# Patient Record
Sex: Male | Born: 1965 | Race: White | Hispanic: No | Marital: Single | Smoking: Heavy tobacco smoker
Health system: Southern US, Community
[De-identification: ages and names within clinical notes are randomized; demographics above are authoritative.]

---

## 2006-09-28 ENCOUNTER — Emergency Department: Payer: Self-pay | Admitting: Emergency Medicine

## 2006-10-12 ENCOUNTER — Other Ambulatory Visit: Payer: Self-pay

## 2006-10-13 ENCOUNTER — Ambulatory Visit: Payer: Self-pay | Admitting: Vascular Surgery

## 2007-04-05 ENCOUNTER — Ambulatory Visit: Payer: Self-pay | Admitting: Internal Medicine

## 2007-05-24 ENCOUNTER — Ambulatory Visit: Payer: Self-pay | Admitting: Emergency Medicine

## 2008-01-05 ENCOUNTER — Ambulatory Visit: Payer: Self-pay | Admitting: Internal Medicine

## 2008-05-01 ENCOUNTER — Ambulatory Visit: Payer: Self-pay | Admitting: Internal Medicine

## 2009-01-23 IMAGING — CR RIGHT HAND - COMPLETE 3+ VIEW
1 series · 3 of 3 positions shown · non-contrast
Comparison: none

REASON FOR EXAM: Injury
COMMENTS:

PROCEDURE:     MDR - MDR HAND RT COMP W/OBLIQUES  - January 05, 2008  [DATE]
RESULT:     Three views were obtained.
No fracture, dislocation or other acute bony abnormality is identified.

[Series 1: view not recorded · 0.17mm/px · 3 of 3 slices shown]
[im 1/3]
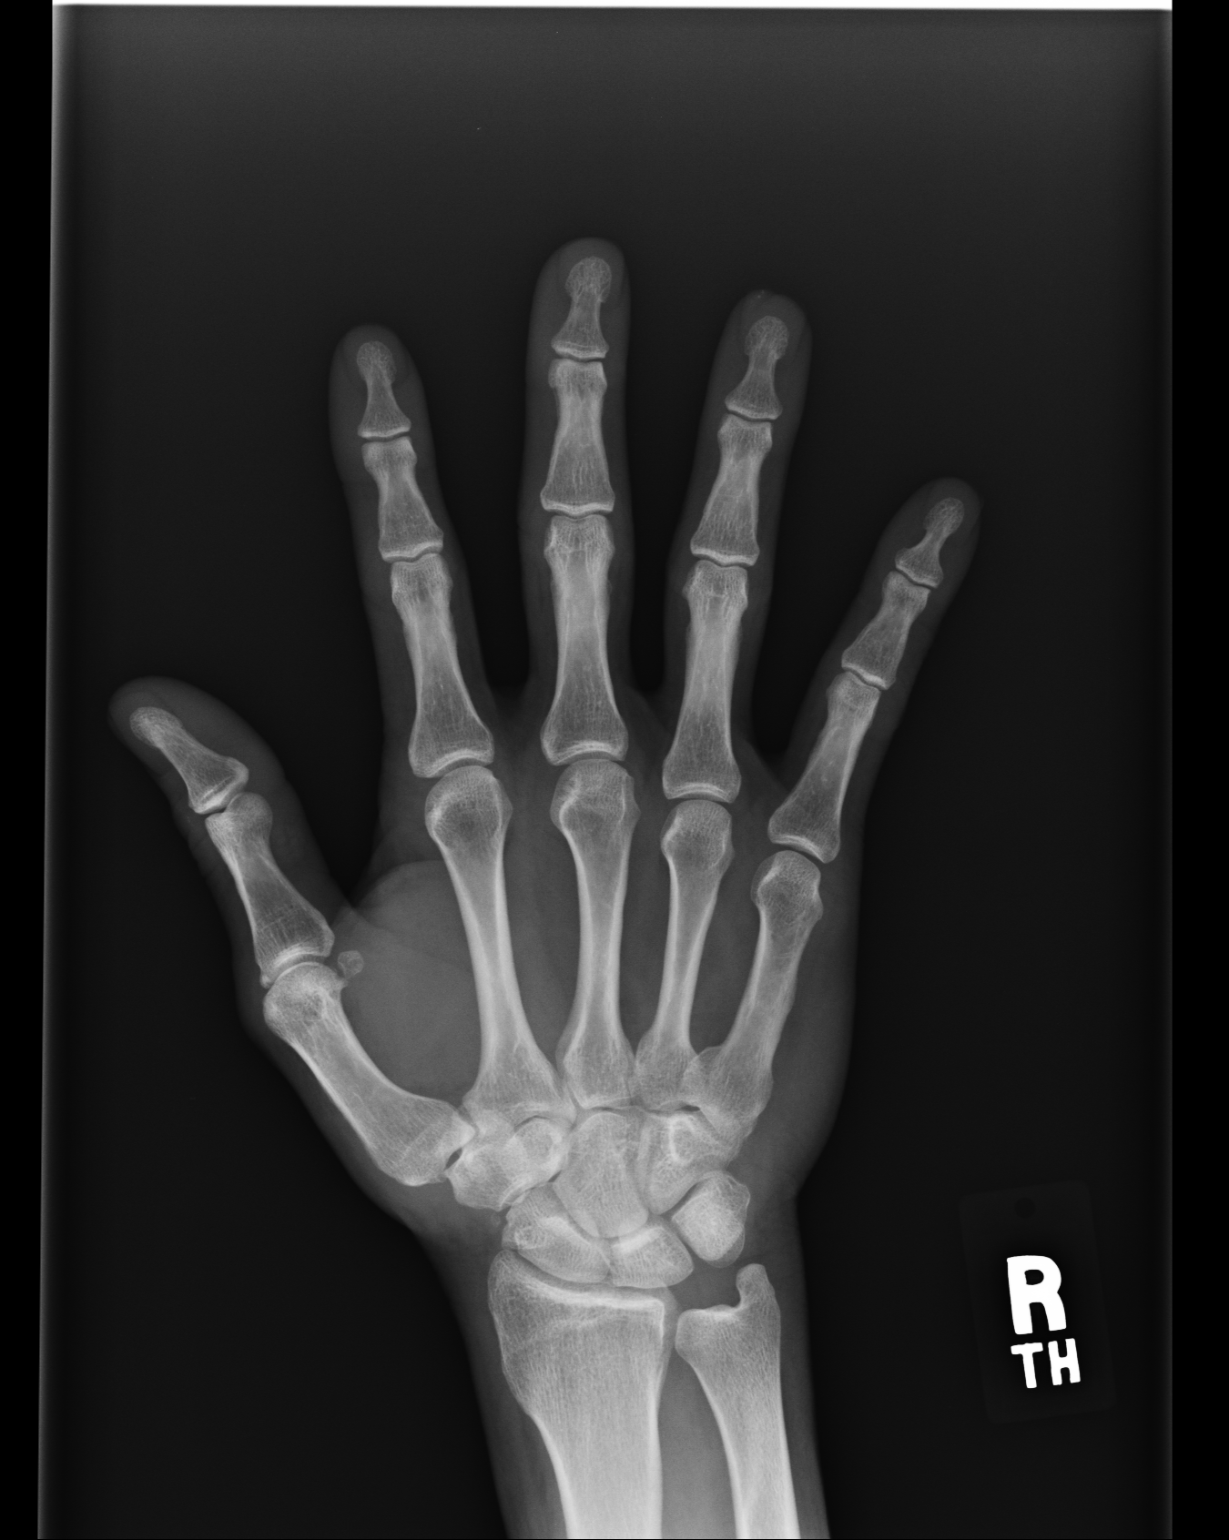
[im 2/3]
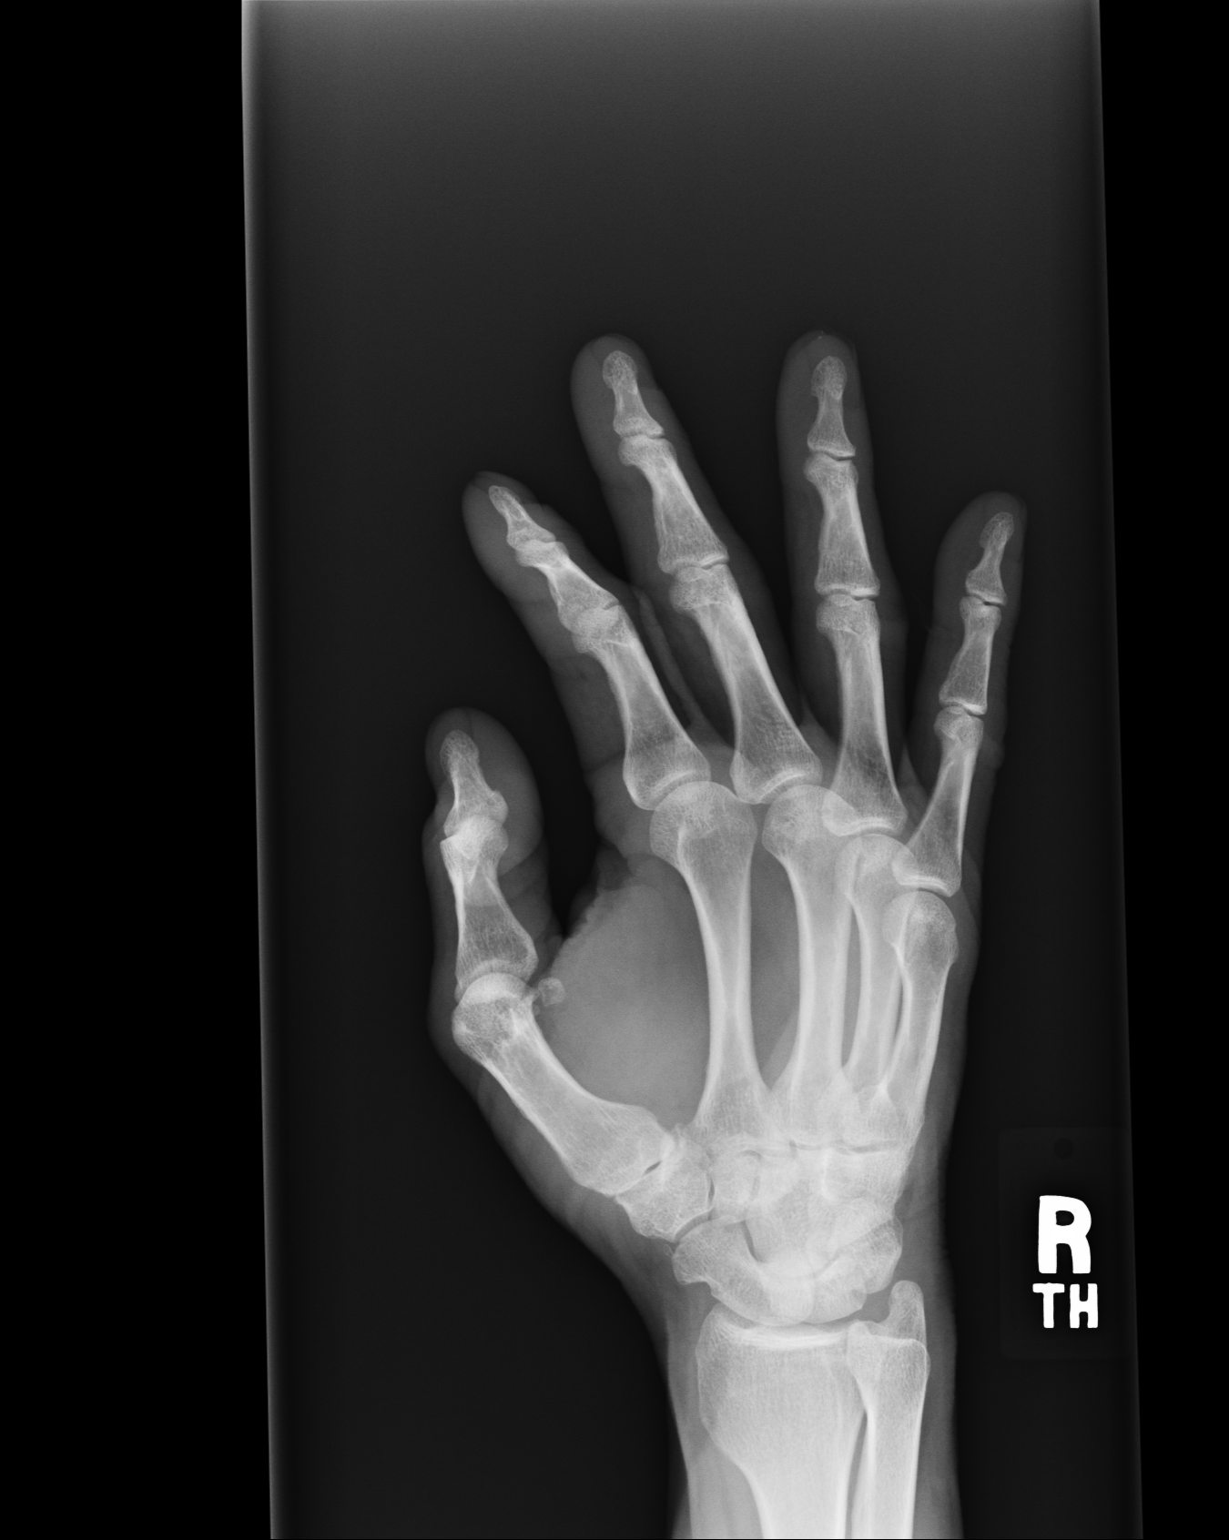
[im 3/3]
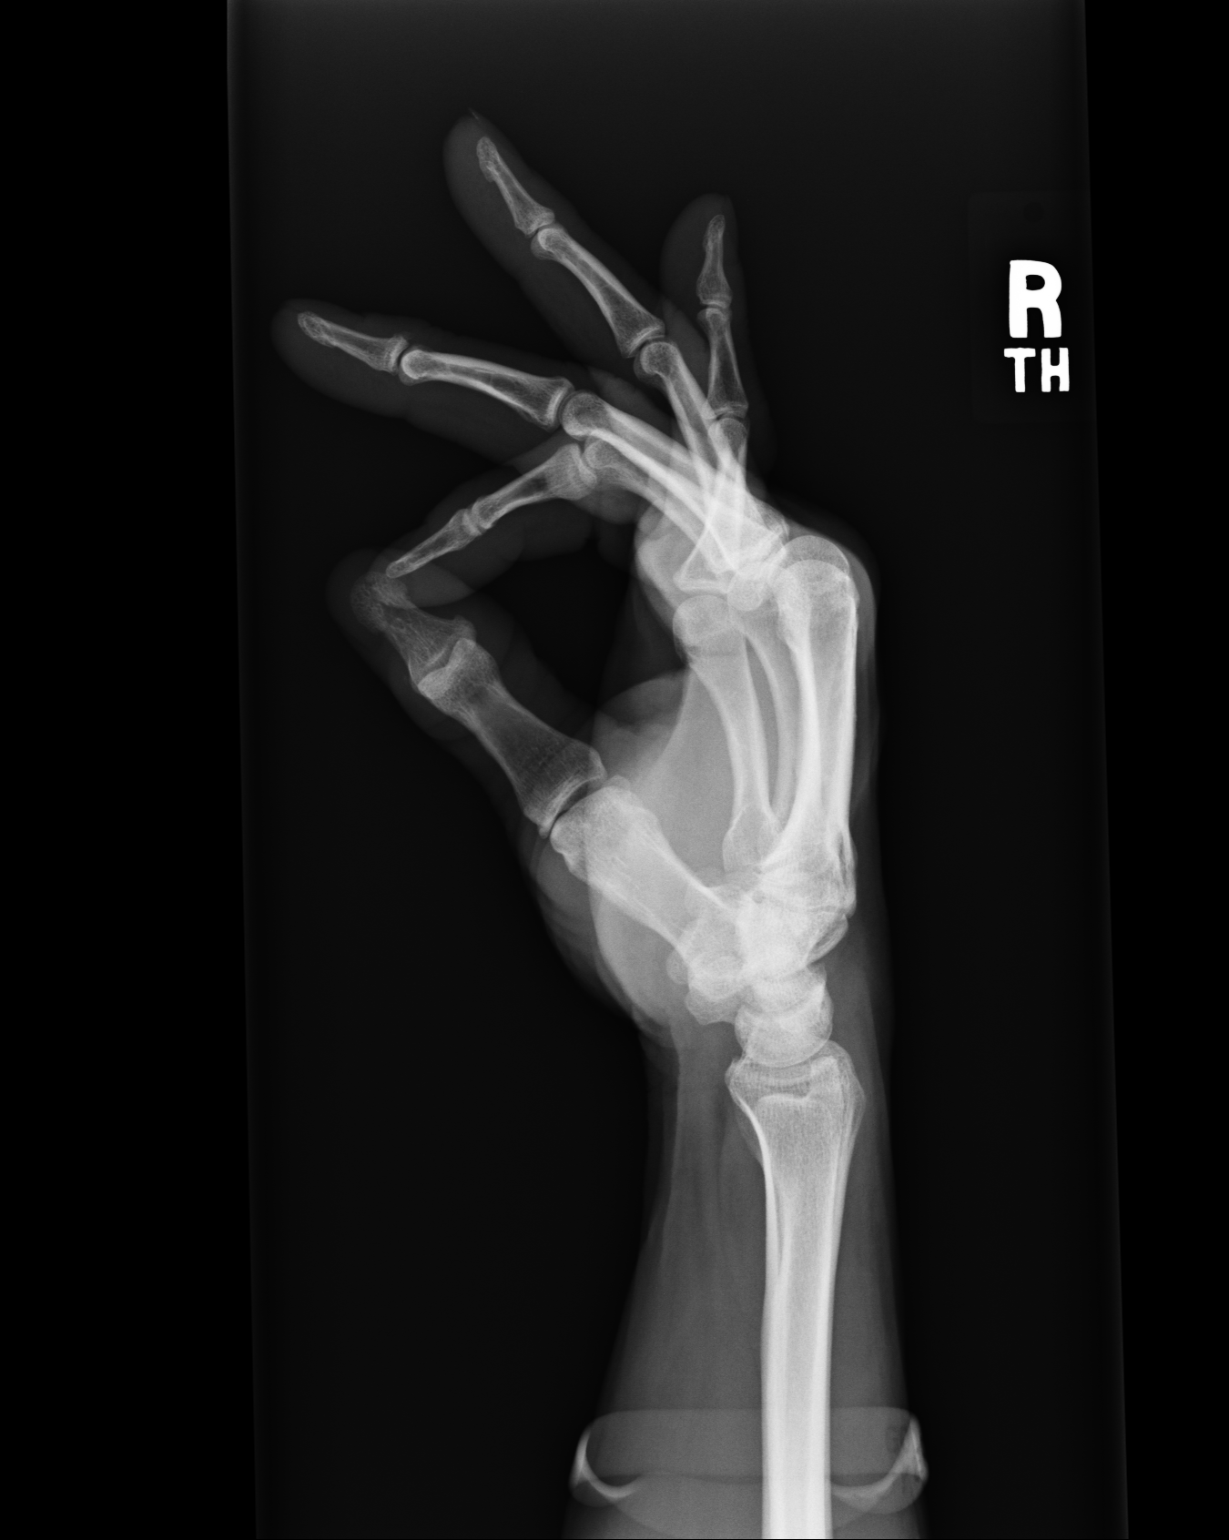

[3 of 3 positions shown; findings below may reference images not displayed]

IMPRESSION: No significant osseous abnormalities are noted.

## 2010-06-03 ENCOUNTER — Emergency Department: Payer: Self-pay | Admitting: Emergency Medicine

## 2010-07-06 ENCOUNTER — Emergency Department: Payer: Self-pay | Admitting: Emergency Medicine

## 2010-07-27 ENCOUNTER — Emergency Department: Payer: Self-pay | Admitting: Emergency Medicine

## 2011-08-15 IMAGING — CT CT STONE STUDY
1 of 2 series · 15 of 32 positions shown, 19 images · non-contrast
Comparison: none

REASON FOR EXAM: (R) flank pain x 14 hrs with nausea, pain radiates to RLQ
COMMENTS:

[Series 2: stone · axial · 0.64mm/px · z∈[+118,+518]mm · 15 of 151 slices shown, 19 images]
[im 12/151  soft-tissue]
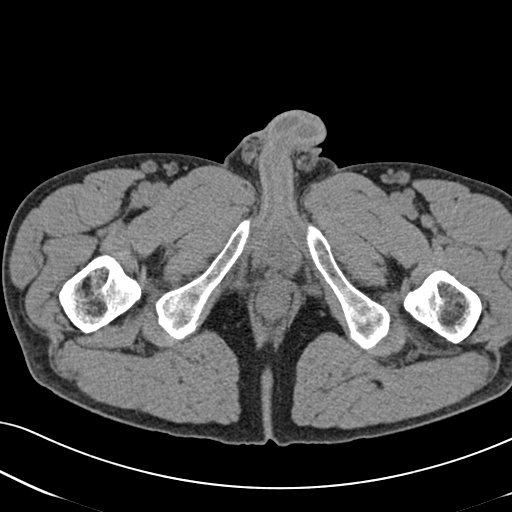
[im 12/151  bone]
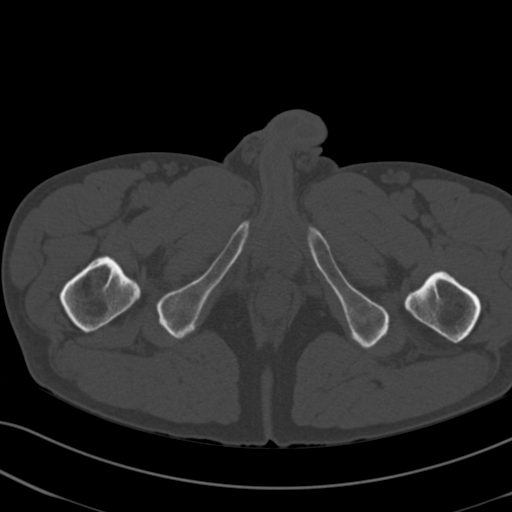
[im 23/151  soft-tissue]
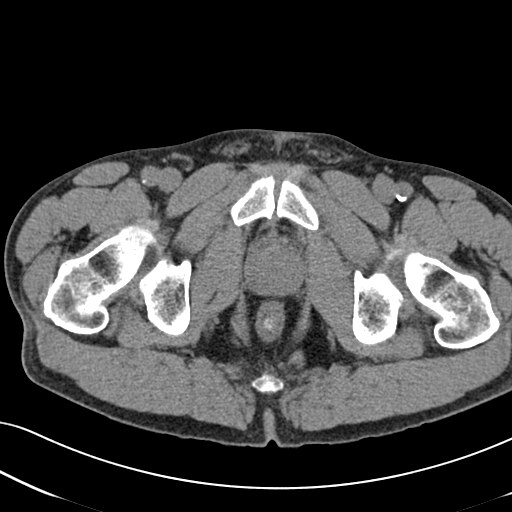
[im 34/151  soft-tissue]
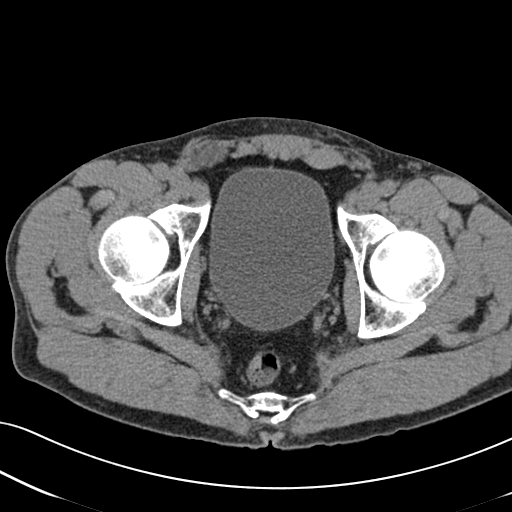
[im 45/151  soft-tissue]
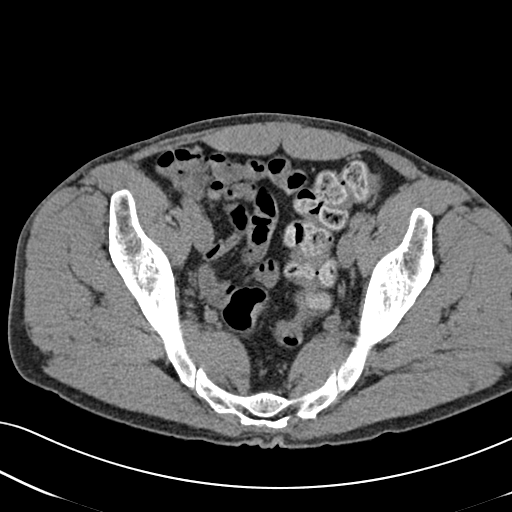
[im 56/151  soft-tissue]
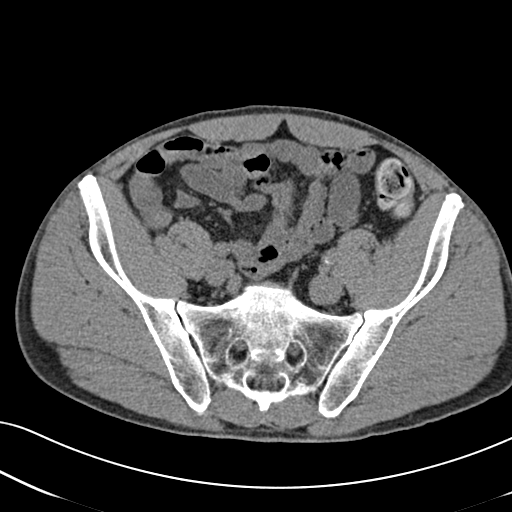
[im 67/151  soft-tissue]
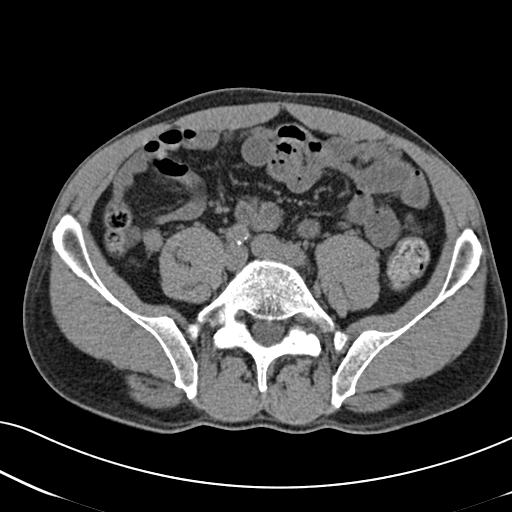
[im 78/151  soft-tissue]
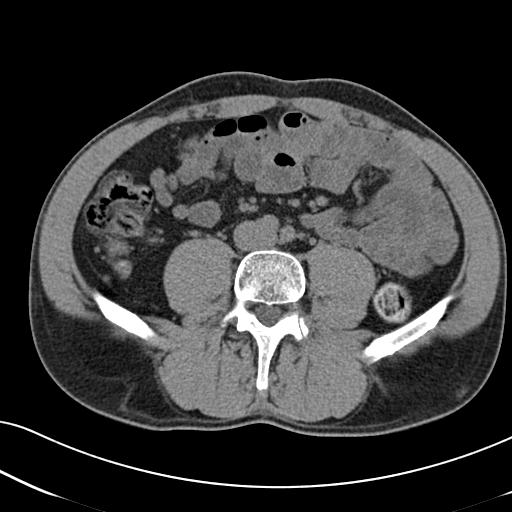
[im 89/151  soft-tissue]
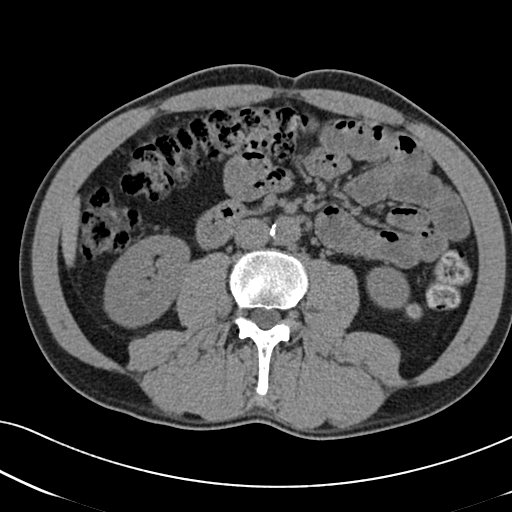
[im 101/151  soft-tissue]
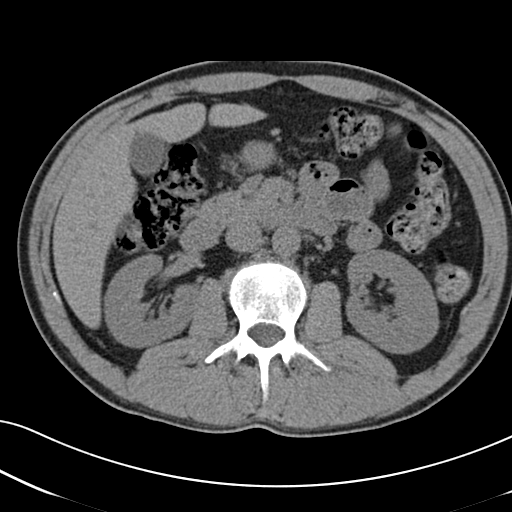
[im 101/151  bone]
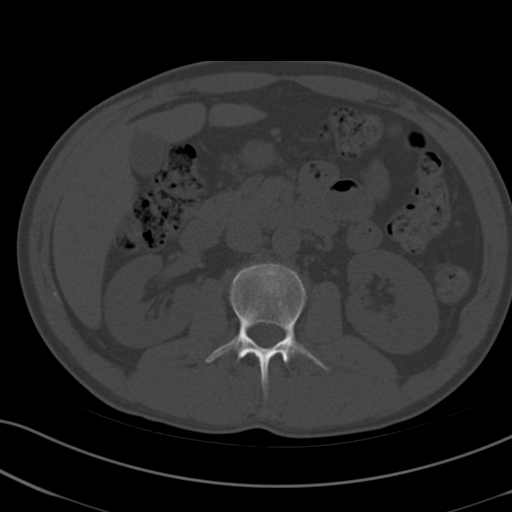
[im 112/151  soft-tissue]
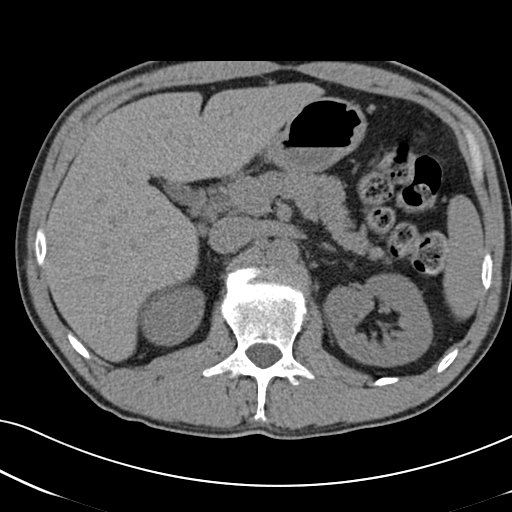
[im 123/151  soft-tissue]
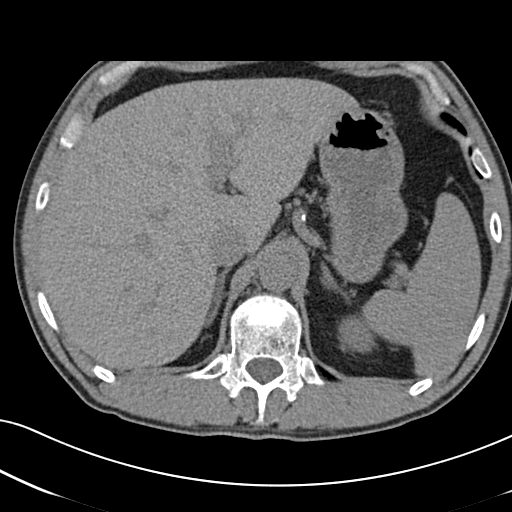
[im 128/151  lung]
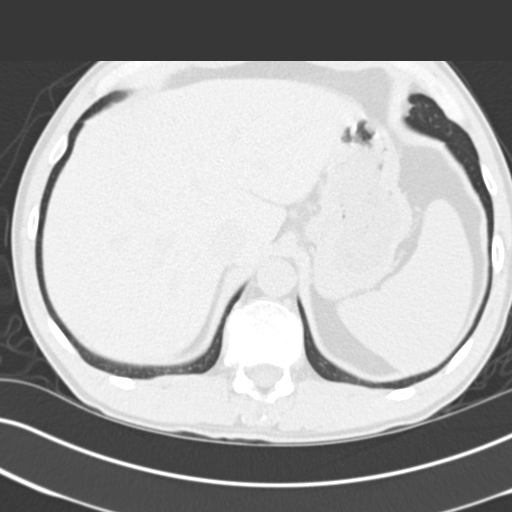
[im 134/151  soft-tissue]
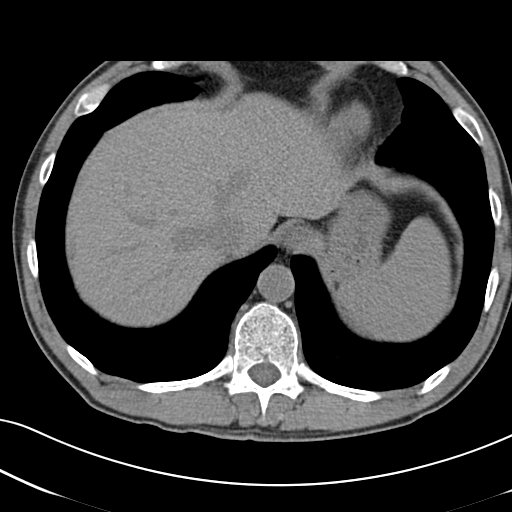
[im 134/151  lung]
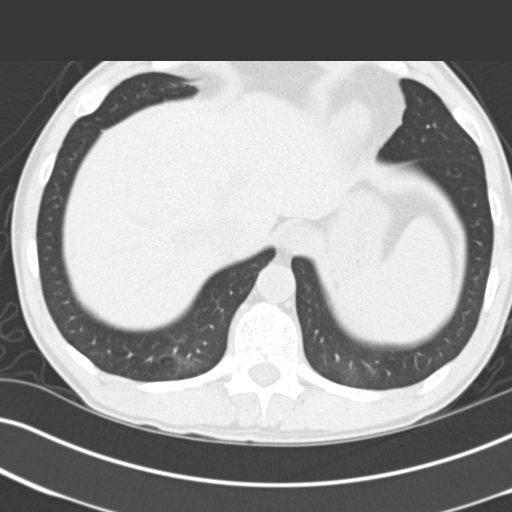
[im 139/151  lung]
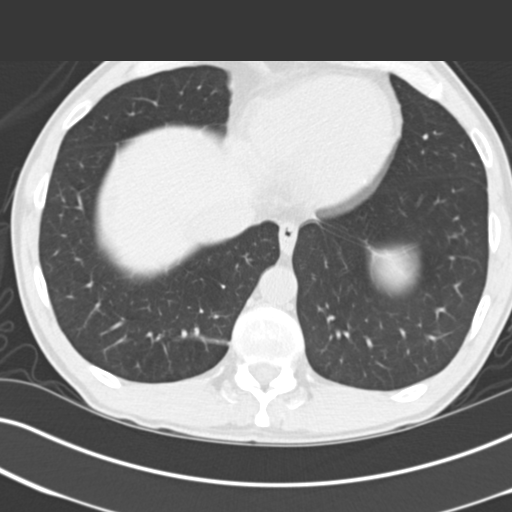
[im 145/151  soft-tissue]
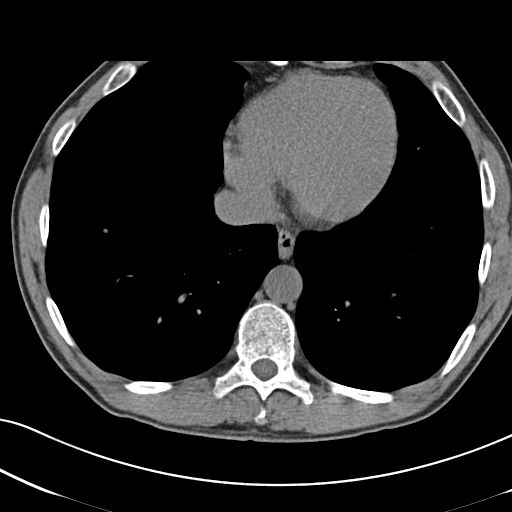
[im 145/151  lung]
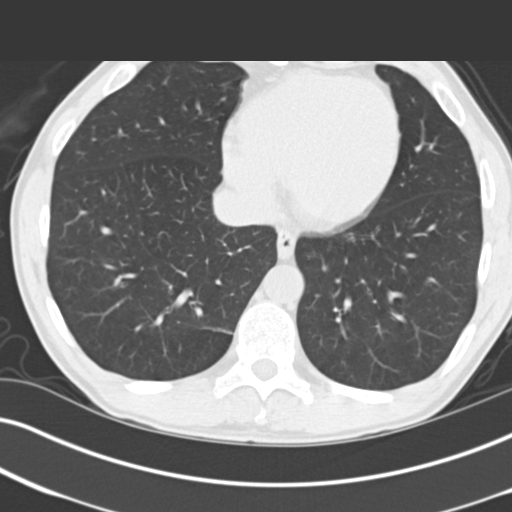

[15 of 32 positions shown; findings below may reference images not displayed]

PROCEDURE:     CT  - CT ABDOMEN /PELVIS WO (STONE)  - July 27, 2010  [DATE]

RESULT:

Helical non-contrast 3 mm sections were obtained from the lung bases through
the pubic symphysis.

Evaluation of the lung base is grossly unremarkable.

The liver, spleen, adrenals, pancreas, and kidneys are unremarkable. There
is no evidence of hydronephrosis, nephrolithiasis nor ureterolithiasis.
There is no CT evidence within the limitations of a noncontrast CT or bowel
obstruction nor secondary signs reflecting enteritis, colitis nor
diverticulitis. Small subcentimeter lymph nodes are identified within the
mesentery in the region of the right lower quadrant. There are also small
retroperitoneal subcentimeter lymph nodes which is a nonspecific finding.
There is no evidence of an abdominal aortic aneurysm.
IMPRESSION: 1. No CT evidence of inflammatory or obstructive abnormalities. 2.
Nonspecific subcentimeter lymph nodes as described above.

Marikie Gaji, Physician's Assistant of the Emergency Department was informed
of theses findings via a preliminary faxed report.

## 2013-06-27 ENCOUNTER — Ambulatory Visit: Payer: Self-pay

## 2015-06-12 ENCOUNTER — Emergency Department
Admission: EM | Admit: 2015-06-12 | Discharge: 2015-06-12 | Disposition: A | Discharge status: Home / Self Care | Payer: BLUE CROSS/BLUE SHIELD | Attending: Emergency Medicine | Admitting: Emergency Medicine

## 2015-06-12 ENCOUNTER — Encounter: Payer: Self-pay | Admitting: Emergency Medicine

## 2015-06-12 DIAGNOSIS — Y998 Other external cause status: Secondary | ICD-10-CM | POA: Insufficient documentation

## 2015-06-12 DIAGNOSIS — Z720 Tobacco use: Secondary | ICD-10-CM | POA: Insufficient documentation

## 2015-06-12 DIAGNOSIS — T1501XA Foreign body in cornea, right eye, initial encounter: Secondary | ICD-10-CM | POA: Diagnosis present

## 2015-06-12 DIAGNOSIS — X58XXXA Exposure to other specified factors, initial encounter: Secondary | ICD-10-CM | POA: Insufficient documentation

## 2015-06-12 DIAGNOSIS — Y9389 Activity, other specified: Secondary | ICD-10-CM | POA: Insufficient documentation

## 2015-06-12 DIAGNOSIS — Y9289 Other specified places as the place of occurrence of the external cause: Secondary | ICD-10-CM | POA: Insufficient documentation

## 2015-06-12 DIAGNOSIS — S0501XA Injury of conjunctiva and corneal abrasion without foreign body, right eye, initial encounter: Secondary | ICD-10-CM | POA: Diagnosis not present

## 2015-06-12 MED ORDER — CIPROFLOXACIN HCL 0.3 % OP SOLN: 1.0000 [drp] | OPHTHALMIC | Status: AC

## 2015-06-12 MED ORDER — ACETAMINOPHEN-CODEINE #3 300-30 MG PO TABS: 1.0000 | ORAL_TABLET | ORAL | Status: AC | PRN

## 2015-06-12 MED ORDER — FLUORESCEIN SODIUM 1 MG OP STRP
ORAL_STRIP | OPHTHALMIC | Status: AC
  Administered 2015-06-12: 21:00:00
  Filled 2015-06-12: qty 1

## 2015-06-12 MED ORDER — CIPROFLOXACIN HCL 0.3 % OP SOLN
2.0000 [drp] | OPHTHALMIC | Status: DC
  Administered 2015-06-12: 2 [drp] via OPHTHALMIC

## 2015-06-12 MED ORDER — CIPROFLOXACIN HCL 0.3 % OP SOLN
OPHTHALMIC | Status: AC
  Administered 2015-06-12: 2 [drp] via OPHTHALMIC
  Filled 2015-06-12: qty 2.5

## 2015-06-12 MED ORDER — TETRACAINE HCL 0.5 % OP SOLN
2.0000 [drp] | Freq: Once | OPHTHALMIC | Status: AC
  Administered 2015-06-12: 2 [drp] via OPHTHALMIC
  Filled 2015-06-12: qty 2

## 2015-06-12 NOTE — ED Notes (Signed)
Patient with no complaints at this time. Respirations even and unlabored. Skin warm/dry. Discharge instructions reviewed with patient at this time. Patient given opportunity to voice concerns/ask questions. Patient discharged at this time and left Emergency Department with steady gait.   

## 2015-06-12 NOTE — ED Provider Notes (Signed)
[facility] Emergency Department Provider Note  ____________________________________________  Time seen: Approximately 9:00 PM  I have reviewed the triage vital signs and the nursing notes.   HISTORY  Chief Complaint Foreign Body in Eye    HPI Jacob Wallace is a 49 y.o. male who was working on an air conditioning unit this afternoon and it blew debris in his eyes. He has had irritation in the right eye since the incident occurred. He rinsed the eye with water just after the incident occurred.    History reviewed. No pertinent past medical history.  There are no active problems to display for this patient.   History reviewed. No pertinent past surgical history.  Current Outpatient Rx  Name  Route  Sig  Dispense  Refill  . acetaminophen-codeine (TYLENOL #3) 300-30 MG per tablet   Oral   Take 1-2 tablets by mouth every 4 (four) hours as needed for moderate pain.   12 tablet   0   . ciprofloxacin (CILOXAN) 0.3 % ophthalmic solution   Right Eye   Place 1 drop into the right eye every 2 (two) hours. Administer 1 drop, every 2 hours, while awake, for 2 days. Then 1 drop, every 4 hours, while awake, for the next 5 days.   5 mL   0     Allergies Review of patient's allergies indicates no known allergies.  No family history on file.  Social History History  Substance Use Topics  . Smoking status: Heavy Tobacco Smoker -- 1.00 packs/day for 15 years    Types: Cigarettes  . Smokeless tobacco: Not on file  . Alcohol Use: Yes    Review of Systems   Constitutional: No fever/chills Eyes: Visual changes: None noted, however it is difficult to keep the eye open at this time.. ENT: No sore throat. Cardiovascular: Denies chest pain. Respiratory: Denies shortness of breath. Gastrointestinal: No abdominal pain.  No nausea, no vomiting.  No diarrhea.  No constipation. Musculoskeletal: Negative for pain. Skin: Negative for rash. Neurological: Negative  for headaches, focal weakness or numbness. Psychiatric:At baseline, no complaint Lymphatic:Swollen nodes-- no Allergic: Seasonal allergies: no 10-point ROS otherwise negative.  ____________________________________________  PHYSICAL EXAM:  VITAL SIGNS: ED Triage Vitals  Enc Vitals Group     BP 06/12/15 2008 127/82 mmHg     Pulse Rate 06/12/15 2008 69     Resp 06/12/15 2008 18     Temp 06/12/15 2008 98.2 F (36.8 C)     Temp Source 06/12/15 2008 Oral     SpO2 06/12/15 2008 94 %     Weight 06/12/15 2008 159 lb (72.122 kg)     Height 06/12/15 2008 5\' 10"  (1.778 m)     Head Cir --      Peak Flow --      Pain Score 06/12/15 2010 4     Pain Loc --      Pain Edu? --      Excl. in GC? --     Constitutional: Alert and oriented. Well appearing and in no acute distress. Eyes: Visual acuity--see nursing documentation; No globe trauma; Eyelids normal to inspection; Everted for exam yes; Conjunctiva and sclera: Injected and mildly erythematous; Corneas: Pinpoint abrasion at the 11:00 position; Examined with fluorescein yes; EOM's intact; Pupils PERRLA; Anterior Chambers normal with limited exam.  Head: Atraumatic. Nose: No congestion/rhinnorhea. Mouth/Throat: Mucous membranes are moist.  Oropharynx non-erythematous. Neck: No stridor.  Cardiovascular: Normal rate, regular rhythm. Grossly normal heart sounds.  Good  peripheral circulation. Respiratory: Normal respiratory effort.  No retractions. Gastrointestinal: Soft and nontender. No distention. No abdominal bruits. No CVA tenderness. Musculoskeletal:Normal ROM Neurologic:  Normal speech and language. No gross focal neurologic deficits are appreciated. Speech is normal. No gait instability. Skin:  Skin is warm, dry and intact. No rash noted. Psychiatric: Mood and affect are normal. Speech and behavior are normal.  ____________________________________________   LABS (all labs ordered are listed, but only abnormal results are  displayed)  Labs Reviewed - No data to display ____________________________________________  EKG   ____________________________________________  RADIOLOGY  Not indicated ____________________________________________   PROCEDURES  Procedure(s) performed: Fluorescein stain to right eye, see assessment  ____________________________________________   INITIAL IMPRESSION / ASSESSMENT AND PLAN / ED COURSE  Pertinent labs & imaging results that were available during my care of the patient were reviewed by me and considered in my medical decision making (see chart for details).  Patient was advised to follow-up with ophthalmology for symptoms that are not improving over the next 2 days. He is advised to return to the emergency department immediately for symptoms that change or worsen if he is unable schedule an appointment with a specialist. ____________________________________________   FINAL CLINICAL IMPRESSION(S) / ED DIAGNOSES  Final diagnoses:  Corneal abrasion, right, initial encounter      Chinita Pester, FNP 06/12/15 2104  Myrna Blazer, MD 06/16/15 2211

## 2015-06-12 NOTE — ED Notes (Signed)
Pt arrived to the ED for complaints of having "something in his eye." Pt states that while he was at work something got in his eye and he thinks that is a metal object. Pt is AOx4 in mild pain with his right eye closed.

## 2015-06-12 NOTE — Discharge Instructions (Signed)
Corneal Abrasion °The cornea is the clear covering at the front and center of the eye. When looking at the colored portion of the eye (iris), you are looking through the cornea. This very thin tissue is made up of many layers. The surface layer is a single layer of cells (corneal epithelium) and is one of the most sensitive tissues in the body. If a scratch or injury causes the corneal epithelium to come off, it is called a corneal abrasion. If the injury extends to the tissues below the epithelium, the condition is called a corneal ulcer. °CAUSES  °· Scratches. °· Trauma. °· Foreign body in the eye. °Some people have recurrences of abrasions in the area of the original injury even after it has healed (recurrent erosion syndrome). Recurrent erosion syndrome generally improves and goes away with time. °SYMPTOMS  °· Eye pain. °· Difficulty or inability to keep the injured eye open. °· The eye becomes very sensitive to light. °· Recurrent erosions tend to happen suddenly, first thing in the morning, usually after waking up and opening the eye. °DIAGNOSIS  °Your health care provider can diagnose a corneal abrasion during an eye exam. Dye is usually placed in the eye using a drop or a small paper strip moistened by your tears. When the eye is examined with a special light, the abrasion shows up clearly because of the dye. °TREATMENT  °· Small abrasions may be treated with antibiotic drops or ointment alone. °· A pressure patch may be put over the eye. If this is done, follow your doctor's instructions for when to remove the patch. Do not drive or use machines while the eye patch is on. Judging distances is hard to do with a patch on. °If the abrasion becomes infected and spreads to the deeper tissues of the cornea, a corneal ulcer can result. This is serious because it can cause corneal scarring. Corneal scars interfere with light passing through the cornea and cause a loss of vision in the involved eye. °HOME CARE  INSTRUCTIONS °· Use medicine or ointment as directed. Only take over-the-counter or prescription medicines for pain, discomfort, or fever as directed by your health care provider. °· Do not drive or operate machinery if your eye is patched. Your ability to judge distances is impaired. °· If your health care provider has given you a follow-up appointment, it is very important to keep that appointment. Not keeping the appointment could result in a severe eye infection or permanent loss of vision. If there is any problem keeping the appointment, let your health care provider know. °SEEK MEDICAL CARE IF:  °· You have pain, light sensitivity, and a scratchy feeling in one eye or both eyes. °· Your pressure patch keeps loosening up, and you can blink your eye under the patch after treatment. °· Any kind of discharge develops from the eye after treatment or if the lids stick together in the morning. °· You have the same symptoms in the morning as you did with the original abrasion days, weeks, or months after the abrasion healed. °MAKE SURE YOU:  °· Understand these instructions. °· Will watch your condition. °· Will get help right away if you are not doing well or get worse. °Document Released: 10/31/2000 Document Revised: 11/08/2013 Document Reviewed: 07/11/2013 °[employer]® Patient Information ©2015 [employer]. This information is not intended to replace advice given to you by your health care provider. Make sure you discuss any questions you have with your health care provider. ° °

## 2016-11-16 ENCOUNTER — Emergency Department
Admission: EM | Admit: 2016-11-16 | Discharge: 2016-11-16 | Disposition: A | Discharge status: Home / Self Care | Payer: BLUE CROSS/BLUE SHIELD | Attending: Emergency Medicine | Admitting: Emergency Medicine

## 2016-11-16 ENCOUNTER — Encounter: Payer: Self-pay | Admitting: Emergency Medicine

## 2016-11-16 DIAGNOSIS — X58XXXA Exposure to other specified factors, initial encounter: Secondary | ICD-10-CM | POA: Diagnosis not present

## 2016-11-16 DIAGNOSIS — Y929 Unspecified place or not applicable: Secondary | ICD-10-CM | POA: Diagnosis not present

## 2016-11-16 DIAGNOSIS — Y9389 Activity, other specified: Secondary | ICD-10-CM | POA: Diagnosis not present

## 2016-11-16 DIAGNOSIS — Y999 Unspecified external cause status: Secondary | ICD-10-CM | POA: Insufficient documentation

## 2016-11-16 DIAGNOSIS — F17210 Nicotine dependence, cigarettes, uncomplicated: Secondary | ICD-10-CM | POA: Insufficient documentation

## 2016-11-16 DIAGNOSIS — T1501XA Foreign body in cornea, right eye, initial encounter: Secondary | ICD-10-CM | POA: Diagnosis not present

## 2016-11-16 MED ORDER — EYE WASH OPHTH SOLN
1.0000 [drp] | OPHTHALMIC | Status: DC | PRN
  Administered 2016-11-16: 1 [drp] via OPHTHALMIC
  Filled 2016-11-16: qty 118

## 2016-11-16 MED ORDER — FLUORESCEIN SODIUM 0.6 MG OP STRP
1.0000 | ORAL_STRIP | Freq: Once | OPHTHALMIC | Status: AC
  Administered 2016-11-16: 1 via OPHTHALMIC

## 2016-11-16 MED ORDER — KETOROLAC TROMETHAMINE 0.4 % OP SOLN - NO CHARGE: 1.0000 [drp] | Freq: Four times a day (QID) | OPHTHALMIC | 0 refills | Status: AC

## 2016-11-16 MED ORDER — TETRACAINE HCL 0.5 % OP SOLN
2.0000 [drp] | Freq: Once | OPHTHALMIC | Status: AC
  Administered 2016-11-16: 2 [drp] via OPHTHALMIC
  Filled 2016-11-16: qty 2

## 2016-11-16 MED ORDER — TETRACAINE HCL 0.5 % OP SOLN
OPHTHALMIC | Status: AC
  Administered 2016-11-16: 2 [drp] via OPHTHALMIC
  Filled 2016-11-16: qty 2

## 2016-11-16 MED ORDER — EYE WASH OPHTH SOLN
OPHTHALMIC | Status: AC
  Administered 2016-11-16: 1 [drp] via OPHTHALMIC
  Filled 2016-11-16: qty 118

## 2016-11-16 MED ORDER — FLUORESCEIN SODIUM 1 MG OP STRP
ORAL_STRIP | OPHTHALMIC | Status: AC
  Administered 2016-11-16: 1 via OPHTHALMIC
  Filled 2016-11-16: qty 1

## 2016-11-16 MED ORDER — CIPROFLOXACIN HCL 0.3 % OP SOLN: OPHTHALMIC | 0 refills | Status: AC

## 2016-11-16 MED ORDER — HYDROCODONE-ACETAMINOPHEN 5-325 MG PO TABS
2.0000 | ORAL_TABLET | Freq: Once | ORAL | Status: AC
  Administered 2016-11-16: 2 via ORAL
  Filled 2016-11-16: qty 2

## 2016-11-16 NOTE — Discharge Instructions (Signed)
Begin using eyedrops as directed. They're to eyedrops. One is ciprofloxacin one drop right eye 4 times a day and Ketorolac 1 drop 4 times a day to the right eye. Follow-up with Dr. Sharman CrateBrassington in the office on Tuesday as he is instructed.

## 2016-11-16 NOTE — Consult Note (Signed)
Reason for Consult:metallic FB OD Referring Physician: ER  Jacob FredricksonKirk D Haller is an 50 y.o. male.  Chief complaint: Pain OD <principal problem not specified>  HPI: 50 was cutting wood and felt FB hit OD on Fri PM. Came to ER this AM and was noted to have corneal FB OD.  Was removed by PA, but still has residual rust ring OD.   History reviewed. No pertinent past medical history.  ROS  History reviewed. No pertinent surgical history.  History reviewed. No pertinent family history.  Social History:  reports that he has been smoking Cigarettes.  He has a 15.00 pack-year smoking history. He has never used smokeless tobacco. He reports that he drinks alcohol. He reports that he does not use drugs.  Allergies: No Known Allergies  Medications: I have reviewed the patient's current medications. Prior to Admission:  (Not in a hospital admission)  No results found for this or any previous visit (from the past 48 hour(s)).  No results found.  Blood pressure (!) 144/85, pulse 61, temperature 97.4 F (36.3 C), temperature source Oral, resp. rate 17, height 5\' 10"  (1.778 m), weight 71.2 kg (157 lb), SpO2 98 %.  Mental status: Alert and Oriented x 4  Visual Acuity:  20/80 OD  20/80 near [place]  Pupils:  Equally round/ reactive to light.  No Afferent defect.  Motility:  Full/ orthophoric  Visual Fields:  Full to confrontation  IOP:  Not tested, nml to palp  External/ Lids/ Lashes:  Normal  Anterior Segment:  Conjunctiva:  2+ injection OD Nml  OS  Cornea:  Rust ruing oresent at 4:00 position OD, fairly central.  Normal  OS  Anterior Chamber: Normal  OU  Lens:   Normal OU    Assessment/Plan: Metallic FB/ Rust ring OD Procedure: Removed w 19 ga needle with tetracaine gtts for anesthesia.  No complications. Rx for Ciproflox and Ketoraloc QID each. F/u Tuesday at [facility]- Dr. Roosvelt HarpsBrasington   Madolin Twaddle 11/16/2016, 11:58 AM

## 2016-11-16 NOTE — ED Triage Notes (Signed)
Pt working with wood product on Friday, unsure if he got dirt or metal in his right eye. C/o burning and tearing in right eye. Has happened before.

## 2016-11-16 NOTE — ED Provider Notes (Signed)
[facility] Emergency Department Provider Note   ____________________________________________   First MD Initiated Contact with Patient 11/16/16 1008     (approximate)  I have reviewed the triage vital signs and the nursing notes.   HISTORY  Chief Complaint Foreign Body in Eye   HPI Jacob Wallace is a 50 y.o. male complaint of right eye pain. Patient states he was working with wood on Friday and felt a particle of something go into his eye. Patient states he was wearing eye protection. Patient has been flushing his eye since that time without any complete relief. He states that he has rubbed his eye frequently which has caused the area surrounding his eye to be extremely sore as well. Other than foreign body sensation and difficulty with bright light he denies any other symptoms. Currently patient rates his pain as an 8 out of 10. Vision is 20/40 bilaterally.   History reviewed. No pertinent past medical history.  There are no active problems to display for this patient.   History reviewed. No pertinent surgical history.  Prior to Admission medications   Medication Sig Start Date End Date Taking? Authorizing Provider  acetaminophen-codeine (TYLENOL #3) 300-30 MG per tablet Take 1-2 tablets by mouth every 4 (four) hours as needed for moderate pain. 06/12/15   Chinita Pesterari B Triplett, FNP  ciprofloxacin (CILOXAN) 0.3 % ophthalmic solution 1 gtt right eye qid 11/16/16 11/21/16  Tommi Rumpshonda L Jakelyn Squyres, PA-C  ketorolac (ACULAR LS) 0.4 % SOLN Place 1 drop into the right eye 4 (four) times daily. 11/16/16   Tommi Rumpshonda L Anniya Whiters, PA-C    Allergies Patient has no known allergies.  History reviewed. No pertinent family history.  Social History Social History  Substance Use Topics  . Smoking status: Heavy Tobacco Smoker    Packs/day: 1.00    Years: 15.00    Types: Cigarettes  . Smokeless tobacco: Never Used  . Alcohol use Yes    Review of Systems Constitutional: No  fever/chills Eyes: Right eye foreign body sensation with photophobia Cardiovascular: Denies chest pain. Respiratory: Denies shortness of breath. Gastrointestinal:   No nausea, no vomiting.  Musculoskeletal: Negative for back pain. Skin: Negative for rash. Neurological: Negative for headaches, focal weakness or numbness.  10-point ROS otherwise negative.  ____________________________________________   PHYSICAL EXAM:  VITAL SIGNS: ED Triage Vitals  Enc Vitals Group     BP 11/16/16 0919 (!) 144/85     Pulse Rate 11/16/16 0919 61     Resp 11/16/16 0919 17     Temp 11/16/16 0919 97.4 F (36.3 C)     Temp Source 11/16/16 0919 Oral     SpO2 11/16/16 0919 98 %     Weight 11/16/16 0920 157 lb (71.2 kg)     Height 11/16/16 0920 5\' 10"  (1.778 m)     Head Circumference --      Peak Flow --      Pain Score 11/16/16 0925 8     Pain Loc --      Pain Edu? --      Excl. in GC? --     Constitutional: Alert and oriented. Well appearing and in no acute distress. Eyes: Right eye with mild erythema. Foreign body is noted medial aspect of the cornea. Tetracaine 2 drops were applied to the right eye. Upper lid was inverted with no foreign body noted. Q-tip was used to remove a nonmetallic foreign body. On fluorescein dye there still continues to show foreign body noted. This  does not have the appearance of a rust ring. Pupils are equal round and reactive to light. EOMs are intact. Head: Atraumatic. Nose: No congestion/rhinnorhea. Neck: No stridor.   Cardiovascular: Normal rate, regular rhythm. Grossly normal heart sounds.  Good peripheral circulation. Respiratory: Normal respiratory effort.  No retractions. Lungs CTAB. Musculoskeletal: Moves upper and lower extremities without any difficulty. Normal gait was noted. Neurologic:  Normal speech and language. No gross focal neurologic deficits are appreciated. No gait instability. Skin:  Skin is warm, dry and intact. No rash noted. Psychiatric:  Mood and affect are normal. Speech and behavior are normal.  ____________________________________________   LABS (all labs ordered are listed, but only abnormal results are displayed)  Labs Reviewed - No data to display   PROCEDURES  Procedure(s) performed: Eye exam that was noted above.   Procedures  Critical Care performed: No  ____________________________________________   INITIAL IMPRESSION / ASSESSMENT AND PLAN / ED COURSE  Pertinent labs & imaging results that were available during my care of the patient were reviewed by me and considered in my medical decision making (see chart for details).    Clinical Course    ----------------------------------------- 11:03 AM on 11/16/2016 -----------------------------------------  Dr. Sharman CrateBrassington was called and patient was discussed with him. Patient will be seen by Dr. Sharman CrateBrassington in the emergency room.  Dr. Sharman CrateBrassington saw the patient in the emergency room and removed the remainder of the foreign body from the right eye. Patient was given prescriptions for ciprofloxacin ophthalmic solution and Acular solution. He is to follow-up with Dr. presentation in the office on Tuesday. ____________________________________________   FINAL CLINICAL IMPRESSION(S) / ED DIAGNOSES  Final diagnoses:  Foreign body in cornea, right eye, initial encounter      NEW MEDICATIONS STARTED DURING THIS VISIT:  Discharge Medication List as of 11/16/2016 12:15 PM    START taking these medications   Details  ciprofloxacin (CILOXAN) 0.3 % ophthalmic solution 1 gtt right eye qid, Print    ketorolac (ACULAR LS) 0.4 % SOLN Place 1 drop into the right eye 4 (four) times daily., Starting Sun 11/16/2016, Print         Note:  This document was prepared using Dragon voice recognition software and may include unintentional dictation errors.    Tommi Rumpshonda L Gina Leblond, PA-C 11/16/16 1517    Jene Everyobert Kinner, MD 11/16/16 1520

## 2016-11-16 NOTE — ED Notes (Signed)
Pt presents to ED with c/o R eye pain and drainage. Pt states was working with wood on Friday, felt a piece of wood fall into his eye, states he thought he was able to flush it out at home but since then c/o redness and clear drainage to R eye at this time.

## 2018-01-19 ENCOUNTER — Other Ambulatory Visit: Payer: Self-pay

## 2018-01-19 ENCOUNTER — Encounter: Payer: Self-pay | Admitting: Emergency Medicine

## 2018-01-19 DIAGNOSIS — F17210 Nicotine dependence, cigarettes, uncomplicated: Secondary | ICD-10-CM | POA: Diagnosis not present

## 2018-01-19 DIAGNOSIS — N2889 Other specified disorders of kidney and ureter: Secondary | ICD-10-CM | POA: Diagnosis not present

## 2018-01-19 DIAGNOSIS — R1084 Generalized abdominal pain: Secondary | ICD-10-CM | POA: Diagnosis present

## 2018-01-19 LAB — CBC
HCT: 43.2 % (ref 40.0–52.0)
Hemoglobin: 14.5 g/dL (ref 13.0–18.0)
MCH: 30.7 pg (ref 26.0–34.0)
MCHC: 33.6 g/dL (ref 32.0–36.0)
MCV: 91.4 fL (ref 80.0–100.0)
Platelets: 425 10*3/uL (ref 150–440)
RBC: 4.73 MIL/uL (ref 4.40–5.90)
RDW: 12.9 % (ref 11.5–14.5)
WBC: 11.1 10*3/uL — ABNORMAL HIGH (ref 3.8–10.6)

## 2018-01-19 NOTE — ED Triage Notes (Signed)
Patient ambulatory to triage with steady gait, without difficulty or distress noted; pt reports left flank pain since last night accomp by chills; st "I think I have a urinary tract infection

## 2018-01-20 ENCOUNTER — Emergency Department: Payer: BLUE CROSS/BLUE SHIELD

## 2018-01-20 ENCOUNTER — Emergency Department
Admission: EM | Admit: 2018-01-20 | Discharge: 2018-01-20 | Disposition: A | Discharge status: Home / Self Care | Payer: BLUE CROSS/BLUE SHIELD | Attending: Emergency Medicine | Admitting: Emergency Medicine

## 2018-01-20 DIAGNOSIS — N2889 Other specified disorders of kidney and ureter: Secondary | ICD-10-CM

## 2018-01-20 DIAGNOSIS — R109 Unspecified abdominal pain: Secondary | ICD-10-CM

## 2018-01-20 LAB — BASIC METABOLIC PANEL
Anion gap: 10 (ref 5–15)
BUN: 13 mg/dL (ref 6–20)
CALCIUM: 9.5 mg/dL (ref 8.9–10.3)
CHLORIDE: 102 mmol/L (ref 101–111)
CO2: 26 mmol/L (ref 22–32)
CREATININE: 0.85 mg/dL (ref 0.61–1.24)
GFR calc Af Amer: >60 mL/min (ref >60)
GFR calc non Af Amer: >60 mL/min (ref >60)
Glucose, Bld: 103 mg/dL — ABNORMAL HIGH (ref 65–99)
Potassium: 3.4 mmol/L — ABNORMAL LOW (ref 3.5–5.1)
Sodium: 138 mmol/L (ref 135–145)

## 2018-01-20 LAB — URINALYSIS, COMPLETE (UACMP) WITH MICROSCOPIC
Bacteria, UA: NONE SEEN
Bilirubin Urine: NEGATIVE
GLUCOSE, UA: NEGATIVE mg/dL
HGB URINE DIPSTICK: NEGATIVE
Ketones, ur: NEGATIVE mg/dL
Leukocytes, UA: NEGATIVE
Nitrite: NEGATIVE
Protein, ur: NEGATIVE mg/dL
SPECIFIC GRAVITY, URINE: 1.02 (ref 1.005–1.030)
Squamous Epithelial / LPF: NONE SEEN
pH: 5 (ref 5.0–8.0)

## 2018-01-20 MED ORDER — SODIUM CHLORIDE 0.9 % IV BOLUS (SEPSIS)
1000.0000 mL | Freq: Once | INTRAVENOUS | Status: AC
  Administered 2018-01-20: 1000 mL via INTRAVENOUS

## 2018-01-20 MED ORDER — KETOROLAC TROMETHAMINE 30 MG/ML IJ SOLN
30.0000 mg | Freq: Once | INTRAMUSCULAR | Status: AC
  Administered 2018-01-20: 30 mg via INTRAVENOUS
  Filled 2018-01-20: qty 1

## 2018-01-20 MED ORDER — TRAMADOL HCL 50 MG PO TABS
50.0000 mg | ORAL_TABLET | Freq: Once | ORAL | Status: AC
  Administered 2018-01-20: 50 mg via ORAL
  Filled 2018-01-20: qty 1

## 2018-01-20 MED ORDER — TRAMADOL HCL 50 MG PO TABS: 50.0000 mg | ORAL_TABLET | Freq: Four times a day (QID) | ORAL | 0 refills | Status: AC | PRN

## 2018-01-20 NOTE — Discharge Instructions (Signed)
Please follow up urology for further evaluation of your renal mass

## 2018-01-20 NOTE — ED Provider Notes (Signed)
[facility] Emergency Department Provider Note   ____________________________________________   First MD Initiated Contact with Patient 01/20/18 0038     (approximate)  I have reviewed the triage vital signs and the nursing notes.   HISTORY  Chief Complaint Flank Pain    HPI Jacob Wallace is a 52 y.o. male who comes into the hospital today with some left-sided flank pain.  The patient states that he had some nausea and cold sweats.  The pain is in his left lower back/flank.  The patient is concerned that this is something going on with his kidneys.  A long time ago he states that he had some problems with his kidneys from drinking too many soft drinks but he denies previous kidney stones.  The patient also denies any vomiting.  His pain is a 6 out of 10 in intensity currently.  The patient states that he did take some ibuprofen and BC powders at home.  He denies any chest pain shortness of breath or diarrhea.  He has had some slight discomfort in his groin but denies pain with urination or hematuria.  The patient is here today for evaluation.  History reviewed. No pertinent past medical history.  There are no active problems to display for this patient.   History reviewed. No pertinent surgical history.  Prior to Admission medications   Medication Sig Start Date End Date Taking? Authorizing Provider  acetaminophen-codeine (TYLENOL #3) 300-30 MG per tablet Take 1-2 tablets by mouth every 4 (four) hours as needed for moderate pain. 06/12/15   Triplett, Rulon Eisenmenger B, FNP  ketorolac (ACULAR LS) 0.4 % SOLN Place 1 drop into the right eye 4 (four) times daily. 11/16/16   Tommi Rumps, PA-C  traMADol (ULTRAM) 50 MG tablet Take 1 tablet (50 mg total) by mouth every 6 (six) hours as needed. 01/20/18   Rebecka Apley, MD    Allergies Patient has no known allergies.  No family history on file.  Social History Social History   Tobacco Use  . Smoking status:  Heavy Tobacco Smoker    Packs/day: 1.00    Years: 15.00    Pack years: 15.00    Types: Cigarettes  . Smokeless tobacco: Never Used  Substance Use Topics  . Alcohol use: Yes  . Drug use: No    Review of Systems  Constitutional: No fever/chills Eyes: No visual changes. ENT: No sore throat. Cardiovascular: Denies chest pain. Respiratory: Denies shortness of breath. Gastrointestinal: No abdominal pain.  No nausea, no vomiting.  No diarrhea.  No constipation. Genitourinary: Negative for dysuria. Musculoskeletal: Left flank pain Skin: Negative for rash. Neurological: Negative for headaches, focal weakness or numbness.   ____________________________________________   PHYSICAL EXAM:  VITAL SIGNS: ED Triage Vitals  Enc Vitals Group     BP 01/19/18 2307 (!) 155/77     Pulse Rate 01/19/18 2307 66     Resp --      Temp 01/19/18 2307 97.9 F (36.6 C)     Temp Source 01/19/18 2307 Oral     SpO2 01/19/18 2307 95 %     Weight 01/19/18 2251 160 lb (72.6 kg)     Height 01/19/18 2251 5\' 9"  (1.753 m)     Head Circumference --      Peak Flow --      Pain Score 01/19/18 2251 7     Pain Loc --      Pain Edu? --      Excl.  in GC? --     Constitutional: Alert and oriented. Well appearing and in moderate distress. Eyes: Conjunctivae are normal. PERRL. EOMI. Head: Atraumatic. Nose: No congestion/rhinnorhea. Mouth/Throat: Mucous membranes are moist.  Oropharynx non-erythematous. Cardiovascular: Normal rate, regular rhythm. Grossly normal heart sounds.  Good peripheral circulation. Respiratory: Normal respiratory effort.  No retractions. Lungs CTAB. Gastrointestinal: Soft and nontender. No distention.  Left CVA tenderness to palpation positive bowel sounds Musculoskeletal: No lower extremity tenderness nor edema.   Neurologic:  Normal speech and language.  Skin:  Skin is warm, dry and intact.  Psychiatric: Mood and affect are normal.   ____________________________________________     LABS (all labs ordered are listed, but only abnormal results are displayed)  Labs Reviewed  URINALYSIS, COMPLETE (UACMP) WITH MICROSCOPIC - Abnormal; Notable for the following components:      Result Value   Color, Urine YELLOW (*)    APPearance CLEAR (*)    All other components within normal limits  CBC - Abnormal; Notable for the following components:   WBC 11.1 (*)    All other components within normal limits  BASIC METABOLIC PANEL - Abnormal; Notable for the following components:   Potassium 3.4 (*)    Glucose, Bld 103 (*)    All other components within normal limits   ____________________________________________  EKG  none ____________________________________________  RADIOLOGY  ED MD interpretation:  CT renal stone study: No hydronephrosis, nephrolithiasis or ureteral stone, 2.1 cm exophytic lesion to the lower pole of the left kidney.  Official radiology report(s): Ct Renal Stone Study  Result Date: 01/20/2018 CLINICAL DATA:  Left flank pain EXAM: CT ABDOMEN AND PELVIS WITHOUT CONTRAST TECHNIQUE: Multidetector CT imaging of the abdomen and pelvis was performed following the standard protocol without IV contrast. COMPARISON:  07/27/2010 FINDINGS: Lower chest: Lung bases demonstrate patchy dependent atelectasis. No acute consolidation or effusion. Borderline to mild cardiomegaly. Hepatobiliary: Contracted gallbladder without calcified stone. No biliary dilatation or focal hepatic abnormality Pancreas: Unremarkable. No pancreatic ductal dilatation or surrounding inflammatory changes. Spleen: Normal in size without focal abnormality. Adrenals/Urinary Tract: Adrenal glands are within normal limits. The kidneys show no hydronephrosis. No ureteral stone. 21 mm slightly dense exophytic lesion lower pole left kidney, a cyst was noted in the region on prior CT. Bladder within normal limits Stomach/Bowel: Stomach is within normal limits. Appendix appears normal. No evidence of bowel wall  thickening, distention, or inflammatory changes. Vascular/Lymphatic: Moderate aortic atherosclerosis. No aneurysmal dilatation. No significantly enlarged lymph nodes. Reproductive: Slightly enlarged prostate with a few calcification Other: Negative for free air or free fluid. Tiny fat in the umbilical region. Musculoskeletal: Degenerative changes. No acute or suspicious lesion IMPRESSION: 1. Negative for hydronephrosis, nephrolithiasis or ureteral stone 2. 21 mm exophytic slightly dense lesion lower pole left kidney. When the patient is clinically stable and able to follow directions and hold their breath (preferably as an outpatient) further evaluation with dedicated abdominal MRI should be considered. Electronically Signed   By: Jasmine PangKim  Fujinaga M.D.   On: 01/20/2018 02:04    ____________________________________________   PROCEDURES  Procedure(s) performed: None  Procedures  Critical Care performed: No  ____________________________________________   INITIAL IMPRESSION / ASSESSMENT AND PLAN / ED COURSE  As part of my medical decision making, I reviewed the following data within the electronic MEDICAL RECORD NUMBER Notes from prior ED visits and [place] Controlled Substance Database   This is a 52 year old male who comes into the hospital today with some left flank pain nausea and cold sweats.  My differential diagnosis includes kidney stone, musculoskeletal pain, urinary tract infection.  I did check some blood work on the patient.  He has a white count of 11.1 but his urine does not show any blood or white blood cells.  His BMP is negative.  I will send the patient for a CT renal stone protocol to evaluate for kidney stones.  The patient will receive a liter of normal saline.     The patient CT scan does not show a kidney stone but shows an exophytic lesion on his left kidney.  The patient needs to have this evaluated as an outpatient.  He did receive a dose of Toradol and I will give him some  tramadol.  He will be discharged home to follow-up. ____________________________________________   FINAL CLINICAL IMPRESSION(S) / ED DIAGNOSES  Final diagnoses:  Left flank pain  Renal mass     ED Discharge Orders        Ordered    traMADol (ULTRAM) 50 MG tablet  Every 6 hours PRN     01/20/18 0250       Note:  This document was prepared using Dragon voice recognition software and may include unintentional dictation errors.    Rebecka Apley, MD 01/20/18 [phone]

## 2018-02-19 ENCOUNTER — Telehealth: Payer: Self-pay | Admitting: Radiology

## 2018-02-19 NOTE — Telephone Encounter (Signed)
Stoioff, Verna CzechScott C, MD  Samuel BoucheP [facility] Admin        Patient was seen in the ED last month with flank pain and noted to have a small renal mass. Recommend an appointment with any provider    Pt [facility]. Encouraged pt to call back if needed & to [facility]. Pt voices understanding.

## 2024-01-30 ENCOUNTER — Ambulatory Visit (INDEPENDENT_AMBULATORY_CARE_PROVIDER_SITE_OTHER)

## 2024-01-30 ENCOUNTER — Ambulatory Visit
Admission: EM | Admit: 2024-01-30 | Discharge: 2024-01-30 | Disposition: A | Discharge status: Home / Self Care | Attending: Physician Assistant | Admitting: Physician Assistant

## 2024-01-30 DIAGNOSIS — R051 Acute cough: Secondary | ICD-10-CM

## 2024-01-30 DIAGNOSIS — R0602 Shortness of breath: Secondary | ICD-10-CM

## 2024-01-30 DIAGNOSIS — R110 Nausea: Secondary | ICD-10-CM | POA: Diagnosis not present

## 2024-01-30 DIAGNOSIS — K921 Melena: Secondary | ICD-10-CM

## 2024-01-30 DIAGNOSIS — J449 Chronic obstructive pulmonary disease, unspecified: Secondary | ICD-10-CM | POA: Diagnosis not present

## 2024-01-30 DIAGNOSIS — R799 Abnormal finding of blood chemistry, unspecified: Secondary | ICD-10-CM

## 2024-01-30 DIAGNOSIS — F17210 Nicotine dependence, cigarettes, uncomplicated: Secondary | ICD-10-CM | POA: Diagnosis not present

## 2024-01-30 DIAGNOSIS — R059 Cough, unspecified: Secondary | ICD-10-CM | POA: Diagnosis present

## 2024-01-30 LAB — BASIC METABOLIC PANEL
Anion gap: 14 (ref 5–15)
BUN: 26 mg/dL — ABNORMAL HIGH (ref 6–20)
CO2: 23 mmol/L (ref 22–32)
Calcium: 8.8 mg/dL — ABNORMAL LOW (ref 8.9–10.3)
Chloride: 99 mmol/L (ref 98–111)
Creatinine, Ser: 0.6 mg/dL — ABNORMAL LOW (ref 0.61–1.24)
GFR, Estimated: >60 mL/min (ref >60)
Glucose, Bld: 117 mg/dL — ABNORMAL HIGH (ref 70–99)
Potassium: 3.5 mmol/L (ref 3.5–5.1)
Sodium: 136 mmol/L (ref 135–145)

## 2024-01-30 LAB — CBC WITH DIFFERENTIAL/PLATELET
Abs Immature Granulocytes: 0.04 10*3/uL (ref 0.00–0.07)
Basophils Absolute: 0 10*3/uL (ref 0.0–0.1)
Basophils Relative: 0 %
Eosinophils Absolute: 0 10*3/uL (ref 0.0–0.5)
Eosinophils Relative: 0 %
HCT: 40.9 % (ref 39.0–52.0)
Hemoglobin: 14.5 g/dL (ref 13.0–17.0)
Immature Granulocytes: 0 %
Lymphocytes Relative: 7 %
Lymphs Abs: 0.7 10*3/uL (ref 0.7–4.0)
MCH: 32.7 pg (ref 26.0–34.0)
MCHC: 35.5 g/dL (ref 30.0–36.0)
MCV: 92.1 fL (ref 80.0–100.0)
Monocytes Absolute: 0.5 10*3/uL (ref 0.1–1.0)
Monocytes Relative: 4 %
Neutro Abs: 9.1 10*3/uL — ABNORMAL HIGH (ref 1.7–7.7)
Neutrophils Relative %: 89 %
Platelets: 293 10*3/uL (ref 150–400)
RBC: 4.44 MIL/uL (ref 4.22–5.81)
RDW: 13.2 % (ref 11.5–15.5)
WBC: 10.4 10*3/uL (ref 4.0–10.5)
nRBC: 0 % (ref 0.0–0.2)

## 2024-01-30 MED ORDER — PREDNISONE 20 MG PO TABS: 20.0000 mg | ORAL_TABLET | Freq: Every day | ORAL | 0 refills | Status: DC

## 2024-01-30 MED ORDER — AMOXICILLIN-POT CLAVULANATE 875-125 MG PO TABS: 1.0000 | ORAL_TABLET | Freq: Two times a day (BID) | ORAL | 0 refills | Status: DC

## 2024-01-30 MED ORDER — ALBUTEROL SULFATE HFA 108 (90 BASE) MCG/ACT IN AERS: 1.0000 | INHALATION_SPRAY | Freq: Four times a day (QID) | RESPIRATORY_TRACT | 0 refills | Status: DC | PRN

## 2024-01-30 MED ORDER — IPRATROPIUM-ALBUTEROL 0.5-2.5 (3) MG/3ML IN SOLN
3.0000 mL | Freq: Once | RESPIRATORY_TRACT | Status: AC
  Administered 2024-01-30: 3 mL via RESPIRATORY_TRACT

## 2024-01-30 MED ORDER — ONDANSETRON 4 MG PO TBDP: 4.0000 mg | ORAL_TABLET | Freq: Three times a day (TID) | ORAL | 0 refills | Status: DC | PRN

## 2024-01-30 NOTE — ED Triage Notes (Signed)
 Pt is with his wife  Pt c/o cough and congestion x1week  Pt states that he is having pain in his abdomen

## 2024-01-30 NOTE — ED Provider Notes (Signed)
 [facility]    CSN: [id number] Arrival date & time: 01/30/24  1419      History   Chief Complaint Chief Complaint  Patient presents with   Cough    HPI Jacob Wallace is a 58 y.o. male.   Patient presents today with a week and a half long history of cough, congestion, fever.  He does have a history of COPD and has been using his Spiriva as prescribed without improvement of symptoms.  Denies hospitalization related to COPD or pneumonia in the past.  Denies any recent antibiotics or steroids.  Denies history of diabetes.  He is a current everyday smoker but has significantly decreased how much he is smoking recently.  He has had COVID-vaccine as well as influenza vaccine.  He has not had COVID recently.  Does report that he has a 58-year-old granddaughter who he now has custody of who is in kindergarten and has been having cold/flu symptoms intermittently for the past few months.  Denies any additional sick contacts.  He is eating and drinking normally.    History reviewed. No pertinent past medical history.  There are no active problems to display for this patient.   History reviewed. No pertinent surgical history.     Home Medications    Prior to Admission medications   Medication Sig Start Date End Date Taking? Authorizing Provider  acetaminophen-codeine (TYLENOL #3) 300-30 MG per tablet Take 1-2 tablets by mouth every 4 (four) hours as needed for moderate pain. 06/12/15  Yes Triplett, Cari B, FNP  buprenorphine-naloxone (SUBOXONE) 2-0.5 mg SUBL SL tablet Place under the tongue.   Yes [provider]  buPROPion (WELLBUTRIN XL) 300 MG 24 hr tablet Take 300 mg by mouth daily.   Yes [provider]  gabapentin (NEURONTIN) 100 MG capsule Take by mouth. 06/26/23 06/25/24 Yes [provider]  ketorolac (ACULAR LS) 0.4 % SOLN Place 1 drop into the right eye 4 (four) times daily. 11/16/16  Yes Bridget Hartshorn L, PA-C  lisinopril (ZESTRIL) 5 MG tablet  Take 5 mg by mouth daily.   Yes [provider]  rosuvastatin (CRESTOR) 40 MG tablet Take 40 mg by mouth daily.   Yes [provider]  sildenafil (REVATIO) 20 MG tablet Take by mouth. 12/25/22  Yes [provider]  tiotropium (SPIRIVA) 18 MCG inhalation capsule Place 1 capsule into inhaler and inhale daily. 06/26/23 06/25/24 Yes [provider]  traMADol (ULTRAM) 50 MG tablet Take 1 tablet (50 mg total) by mouth every 6 (six) hours as needed. 01/20/18  Yes Rebecka Apley, MD    Family History History reviewed. No pertinent family history.  Social History Social History   Tobacco Use   Smoking status: Heavy Smoker    Current packs/day: 1.00    Average packs/day: 1 pack/day for 15.0 years (15.0 ttl pk-yrs)    Types: Cigarettes   Smokeless tobacco: Never  Vaping Use   Vaping status: Never Used  Substance Use Topics   Alcohol use: Yes   Drug use: No     Allergies   Patient has no known allergies.   Review of Systems Review of Systems  Constitutional:  Positive for activity change and fatigue. Negative for appetite change and fever.  HENT:  Positive for congestion and sore throat. Negative for sinus pressure and sneezing.   Respiratory:  Positive for cough, chest tightness and shortness of breath. Negative for wheezing.   Cardiovascular:  Negative for chest pain.  Gastrointestinal:  Negative for abdominal pain, diarrhea, nausea and vomiting.  Musculoskeletal:  Negative for arthralgias and myalgias.  Neurological:  Negative for dizziness, light-headedness and headaches.     Physical Exam Triage Vital Signs ED Triage Vitals  Encounter Vitals Group     BP 01/30/24 1452 (!) 147/95     Systolic BP Percentile --      Diastolic BP Percentile --      Pulse Rate 01/30/24 1452 93     Resp --      Temp 01/30/24 1452 97.9 F (36.6 C)     Temp Source 01/30/24 1452 Oral     SpO2 01/30/24 1452 95 %     Weight 01/30/24 1453 159 lb (72.1 kg)      Height 01/30/24 1453 5\' 8"  (1.727 m)     Head Circumference --      Peak Flow --      Pain Score 01/30/24 1453 5     Pain Loc --      Pain Education --      Exclude from Growth Chart --    No data found.  Updated Vital Signs BP (!) 147/95 (BP Location: Left Arm)   Pulse 93   Temp 97.9 F (36.6 C) (Oral)   Ht 5\' 8"  (1.727 m)   Wt 159 lb (72.1 kg)   SpO2 95%   BMI 24.18 kg/m   Visual Acuity Right Eye Distance:   Left Eye Distance:   Bilateral Distance:    Right Eye Near:   Left Eye Near:    Bilateral Near:     Physical Exam Vitals reviewed.  Constitutional:      General: He is awake.     Appearance: Normal appearance. He is well-developed. He is not ill-appearing.     Comments: Very pleasant male appears stated age laying on exam room table obviously uncomfortable but in no acute distress  HENT:     Head: Normocephalic and atraumatic.     Right Ear: Tympanic membrane, ear canal and external ear normal. Tympanic membrane is not erythematous or bulging.     Left Ear: Tympanic membrane, ear canal and external ear normal. Tympanic membrane is not erythematous or bulging.     Nose: Nose normal.     Mouth/Throat:     Pharynx: Uvula midline. Posterior oropharyngeal erythema present. No oropharyngeal exudate or uvula swelling.  Cardiovascular:     Rate and Rhythm: Normal rate and regular rhythm.     Heart sounds: Normal heart sounds, S1 normal and S2 normal. No murmur heard. Pulmonary:     Effort: Pulmonary effort is normal. No accessory muscle usage or respiratory distress.     Breath sounds: No stridor. Wheezing present. No rhonchi or rales.  Abdominal:     General: Bowel sounds are normal.     Palpations: Abdomen is soft.     Tenderness: There is no abdominal tenderness. There is no right CVA tenderness, left CVA tenderness, guarding or rebound.  Neurological:     Mental Status: He is alert.  Psychiatric:        Behavior: Behavior is cooperative.      UC  Treatments / Results  Labs (all labs ordered are listed, but only abnormal results are displayed) Labs Reviewed  BASIC METABOLIC PANEL - Abnormal; Notable for the following components:      Result Value   Glucose, Bld 117 (*)    BUN 26 (*)    Creatinine, Ser 0.60 (*)    Calcium  8.8 (*)    All other components within normal limits  CBC WITH DIFFERENTIAL/PLATELET - Abnormal; Notable for the following components:   Neutro Abs 9.1 (*)    All other components within normal limits    EKG   Radiology DG Chest 2 View Result Date: 01/30/2024 CLINICAL DATA:  Cough EXAM: CHEST - 2 VIEW COMPARISON:  None Available. 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 Hyperinflated lungs. No focal airspace consolidation, pleural effusion, or pneumothorax. IMPRESSION: No active cardiopulmonary disease. Electronically Signed   By: Duanne Guess D.O.   On: 01/30/2024 16:07    Procedures Procedures (including critical care time)  Medications Ordered in UC Medications  ipratropium-albuterol (DUONEB) 0.5-2.5 (3) MG/3ML nebulizer solution 3 mL (3 mLs Nebulization Given 01/30/24 1550)    Initial Impression / Assessment and Plan / UC Course  I have reviewed the triage vital signs and the nursing notes.  Pertinent labs & imaging results that were available during my care of the patient were reviewed by me and considered in my medical decision making (see chart for details).     Patient is well-appearing, afebrile, nontoxic, nontachycardic.  Initially thought symptoms were related to COPD exacerbation as he reported ongoing cough and shortness of breath.  He was given a DuoNeb with some improvement of symptoms but still felt very poorly.  Chest x-ray was obtained that showed no acute cardiopulmonary disease.  His blood counts were normal with no evidence of anemia or leukocytosis.  We were going to discharge him home with treatment for COPD exacerbation when he received  his metabolic panel that showed elevated BUN.  After further evaluation patient did report a several day history of melena with some upper abdominal pain and nausea.  Discussed that given his clinical presentation I am concerned for a bleeding ulcer and since he is feeling so poorly this evening to do would be to go to the emergency room since we do not have imaging capabilities in urgent care.  He was agreeable and will go directly to [facility] ER for further evaluation and management.  He was stable at the time of discharge and safe for private transport; his wife will take him to the ER.  Final Clinical Impressions(s) / UC Diagnoses   Final diagnoses:  Acute cough  Nausea without vomiting  Elevated BUN  SOB (shortness of breath)  Melena     Discharge Instructions      Go directly to the emergency room as we discussed.     ED Prescriptions     Medication Sig Dispense Auth. Provider   amoxicillin-clavulanate (AUGMENTIN) 875-125 MG tablet  (Status: Discontinued) Take 1 tablet by mouth every 12 (twelve) hours. 14 tablet Abem Shaddix K, PA-C   predniSONE (DELTASONE) 20 MG tablet  (Status: Discontinued) Take 1 tablet (20 mg total) by mouth daily for 4 days. 4 tablet Jarrah Babich K, PA-C   albuterol (VENTOLIN HFA) 108 (90 Base) MCG/ACT inhaler  (Status: Discontinued) Inhale 1-2 puffs into the lungs every 6 (six) hours as needed for wheezing or shortness of breath. 18 g Nhi Butrum K, PA-C   ondansetron (ZOFRAN-ODT) 4 MG disintegrating tablet  (Status: Discontinued) Take 1 tablet (4 mg total) by mouth every 8 (eight) hours as needed for nausea or vomiting. 12 tablet Mingo Siegert, Noberto Retort, PA-C      PDMP not reviewed this encounter.   Jeani Hawking, PA-C 01/30/24 1646

## 2024-01-30 NOTE — ED Notes (Signed)
 Patient is being discharged from the Urgent Care and sent to the [facility] Emergency Department via private vehicle . Per Denny Peon Rapset, PA, patient is in need of higher level of care due to SOB. Patient is aware and verbalizes understanding of plan of care.  Vitals:   01/30/24 1452  BP: (!) 147/95  Pulse: 93  Temp: 97.9 F (36.6 C)  SpO2: 95%

## 2024-01-30 NOTE — Discharge Instructions (Addendum)
 Go directly to the emergency room as we discussed.
